# Patient Record
Sex: Male | Born: 1950 | Race: Black or African American | Hispanic: No | Marital: Single | Smoking: Current every day smoker
Health system: Southern US, Community
[De-identification: ages and names within clinical notes are randomized; demographics above are authoritative.]

## PROBLEM LIST (undated history)

## (undated) DIAGNOSIS — B1920 Unspecified viral hepatitis C without hepatic coma: Secondary | ICD-10-CM

## (undated) HISTORY — DX: Unspecified viral hepatitis C without hepatic coma: B19.20

## (undated) HISTORY — PX: APPENDECTOMY: SHX54

## (undated) HISTORY — PX: OTHER SURGICAL HISTORY: SHX169

---

## 2009-03-29 ENCOUNTER — Other Ambulatory Visit: Payer: Self-pay | Admitting: Emergency Medicine

## 2009-03-29 ENCOUNTER — Ambulatory Visit: Payer: Self-pay | Admitting: Psychiatry

## 2009-03-30 ENCOUNTER — Inpatient Hospital Stay (HOSPITAL_COMMUNITY): Admission: AD | Admit: 2009-03-30 | Discharge: 2009-04-07 | Payer: Self-pay | Admitting: Psychiatry

## 2009-07-27 ENCOUNTER — Ambulatory Visit: Payer: Self-pay | Admitting: Cardiology

## 2009-11-26 ENCOUNTER — Emergency Department (HOSPITAL_COMMUNITY): Admission: EM | Admit: 2009-11-26 | Discharge: 2009-11-27 | Payer: Self-pay | Admitting: Emergency Medicine

## 2009-12-08 ENCOUNTER — Emergency Department (HOSPITAL_COMMUNITY): Admission: EM | Admit: 2009-12-08 | Discharge: 2009-12-09 | Payer: Self-pay | Admitting: Emergency Medicine

## 2010-01-24 ENCOUNTER — Ambulatory Visit: Payer: Self-pay | Admitting: Cardiology

## 2010-09-02 LAB — BASIC METABOLIC PANEL
BUN: 11 mg/dL (ref 6–23)
Potassium: 4.1 mEq/L (ref 3.5–5.1)

## 2010-09-02 LAB — DIFFERENTIAL
Basophils Absolute: 0 10*3/uL (ref 0.0–0.1)
Basophils Relative: 1 % (ref 0–1)
Eosinophils Relative: 2 % (ref 0–5)
Lymphs Abs: 2.3 10*3/uL (ref 0.7–4.0)
Monocytes Absolute: 0.9 10*3/uL (ref 0.1–1.0)
Monocytes Relative: 10 % (ref 3–12)
Neutrophils Relative %: 60 % (ref 43–77)

## 2010-09-02 LAB — POCT CARDIAC MARKERS
Myoglobin, poc: 83.1 ng/mL (ref 12–200)
Troponin i, poc: <0.05 ng/mL (ref 0.00–0.09)

## 2010-09-02 LAB — D-DIMER, QUANTITATIVE: D-Dimer, Quant: 0.23 ug/mL-FEU (ref 0.00–0.48)

## 2010-09-02 LAB — CBC: Hemoglobin: 14.2 g/dL (ref 13.0–17.0)

## 2010-09-03 LAB — BASIC METABOLIC PANEL
CO2: 28 mEq/L (ref 19–32)
Calcium: 9.4 mg/dL (ref 8.4–10.5)
Chloride: 105 mEq/L (ref 96–112)
Glucose, Bld: 89 mg/dL (ref 70–99)
Potassium: 3.9 mEq/L (ref 3.5–5.1)
Sodium: 140 mEq/L (ref 135–145)

## 2010-09-03 LAB — CBC
HCT: 41.3 % (ref 39.0–52.0)
MCV: 86.7 fL (ref 78.0–100.0)
Platelets: 260 10*3/uL (ref 150–400)
RDW: 14.1 % (ref 11.5–15.5)
WBC: 8.8 10*3/uL (ref 4.0–10.5)

## 2010-09-03 LAB — DIFFERENTIAL
Eosinophils Relative: 2 % (ref 0–5)
Lymphocytes Relative: 25 % (ref 12–46)

## 2010-09-20 LAB — DIFFERENTIAL
Basophils Absolute: 0 10*3/uL (ref 0.0–0.1)
Basophils Relative: 1 % (ref 0–1)
Monocytes Absolute: 0.5 10*3/uL (ref 0.1–1.0)
Neutro Abs: 4.6 10*3/uL (ref 1.7–7.7)
Neutrophils Relative %: 61 % (ref 43–77)

## 2010-09-20 LAB — GLUCOSE, CAPILLARY
Glucose-Capillary: 112 mg/dL — ABNORMAL HIGH (ref 70–99)
Glucose-Capillary: 120 mg/dL — ABNORMAL HIGH (ref 70–99)
Glucose-Capillary: 121 mg/dL — ABNORMAL HIGH (ref 70–99)
Glucose-Capillary: 123 mg/dL — ABNORMAL HIGH (ref 70–99)
Glucose-Capillary: 141 mg/dL — ABNORMAL HIGH (ref 70–99)
Glucose-Capillary: 145 mg/dL — ABNORMAL HIGH (ref 70–99)
Glucose-Capillary: 241 mg/dL — ABNORMAL HIGH (ref 70–99)
Glucose-Capillary: 80 mg/dL (ref 70–99)
Glucose-Capillary: 83 mg/dL (ref 70–99)

## 2010-09-20 LAB — RAPID URINE DRUG SCREEN, HOSP PERFORMED
Amphetamines: NOT DETECTED
Barbiturates: NOT DETECTED
Benzodiazepines: POSITIVE — AB
Cocaine: NOT DETECTED
Opiates: NOT DETECTED

## 2010-09-20 LAB — BASIC METABOLIC PANEL
BUN: 10 mg/dL (ref 6–23)
CO2: 29 mEq/L (ref 19–32)
Calcium: 9.6 mg/dL (ref 8.4–10.5)
Creatinine, Ser: 0.95 mg/dL (ref 0.4–1.5)
Glucose, Bld: 114 mg/dL — ABNORMAL HIGH (ref 70–99)

## 2010-09-20 LAB — CBC
Hemoglobin: 15.9 g/dL (ref 13.0–17.0)
MCHC: 33.1 g/dL (ref 30.0–36.0)
Platelets: 209 10*3/uL (ref 150–400)
RDW: 14.4 % (ref 11.5–15.5)

## 2010-09-20 LAB — HEMOGLOBIN A1C: Hgb A1c MFr Bld: 9.7 % — ABNORMAL HIGH (ref 4.6–6.1)

## 2011-01-08 ENCOUNTER — Encounter (INDEPENDENT_AMBULATORY_CARE_PROVIDER_SITE_OTHER): Payer: Self-pay | Admitting: Internal Medicine

## 2011-01-08 ENCOUNTER — Ambulatory Visit (INDEPENDENT_AMBULATORY_CARE_PROVIDER_SITE_OTHER): Payer: Medicaid Other | Admitting: Internal Medicine

## 2011-01-08 ENCOUNTER — Encounter (INDEPENDENT_AMBULATORY_CARE_PROVIDER_SITE_OTHER): Payer: Self-pay | Admitting: *Deleted

## 2011-01-08 VITALS — BP 90/60 | HR 72 | Temp 97.8°F | Ht 73.0 in | Wt 185.4 lb

## 2011-01-08 DIAGNOSIS — Z1211 Encounter for screening for malignant neoplasm of colon: Secondary | ICD-10-CM

## 2011-01-08 DIAGNOSIS — B1920 Unspecified viral hepatitis C without hepatic coma: Secondary | ICD-10-CM

## 2011-01-08 LAB — HEPATIC FUNCTION PANEL
ALT: 36 U/L (ref 0–53)
Bilirubin, Direct: 0.1 mg/dL (ref 0.0–0.3)
Total Protein: 7.5 g/dL (ref 6.0–8.3)

## 2011-01-08 NOTE — Patient Instructions (Signed)
The risk and benefits were reviewed with patient and he is agreeable to proceed with the colonoscopy.

## 2011-01-08 NOTE — Progress Notes (Signed)
Subjective:     Patient ID: BERTHEL BAGNALL, male   DOB: 25-Dec-1950, 60 y.o.   MRN: [id number]  HPI Mr. Conlee is a 60 yr old black male referred to our office by Dr. Gae Gallop for a screening colonoscopy and evaluation of possible tx of Hepatitis C.   [place] has never undergone a screening colonoscopy.  Appetite is good. No dysphagia. No weight loss. No abdominal pain. BM x 1-2 a day.  No bright red rectal bleeding or melena. He has a hx of IV heroin use years ago.  He has also used Cocaine in the past.  [facility] for Haldol injections. 09/11/2010 HCV RNA [id number], Genotype 1B. Hx of schizophrenia  Review of Systems     Objective:   Physical ExamAlert and oriented. Skin warm and dry. Oral mucosa is moist. Patient is endentulous.Sclera anicteric, conjunctivae is pink. No cervical lymphadenopathy. Lungs clear. Heart regular rate and rhythm.  Abdomen is soft. Bowel sounds are positive. No hepatomegaly. No abdominal masses felt. No tenderness. No edema to lower extremities. Patient is alert and oriented.  Patient smells of urine.     Assessment:     Mr. Pasquariello is a 60 yr old male in need of a screening colonoscopy. He has never undergone a colonoscopy in the past.  As far as his Hepatitis C, we will hold at this time due to his hx of schizophrenia.  Will get a hepatic function on him today.    Plan:      Will schedule a colonoscopy with Dr. Karilyn Cota. The risk and benefits were reviewed with the patient and he is agreeable.    Will also get a hepatic function on him today.  Further recommendations once I talk with Dr Karilyn Cota.

## 2011-01-09 ENCOUNTER — Telehealth (INDEPENDENT_AMBULATORY_CARE_PROVIDER_SITE_OTHER): Payer: Self-pay | Admitting: Internal Medicine

## 2011-02-19 MED ORDER — SODIUM CHLORIDE 0.45 % IV SOLN
Freq: Once | INTRAVENOUS | Status: AC
  Administered 2011-02-20: 12:00:00 via INTRAVENOUS

## 2011-02-20 ENCOUNTER — Encounter (HOSPITAL_COMMUNITY): Payer: Self-pay | Admitting: *Deleted

## 2011-02-20 ENCOUNTER — Other Ambulatory Visit (INDEPENDENT_AMBULATORY_CARE_PROVIDER_SITE_OTHER): Payer: Self-pay | Admitting: Internal Medicine

## 2011-02-20 ENCOUNTER — Ambulatory Visit (HOSPITAL_COMMUNITY)
Admission: RE | Admit: 2011-02-20 | Discharge: 2011-02-20 | Disposition: A | Discharge status: Home / Self Care | Payer: Medicaid Other | Source: Ambulatory Visit | Attending: Internal Medicine | Admitting: Internal Medicine

## 2011-02-20 ENCOUNTER — Encounter (HOSPITAL_COMMUNITY)
Admission: RE | Disposition: A | Discharge status: Home / Self Care | Payer: Self-pay | Source: Ambulatory Visit | Attending: Internal Medicine

## 2011-02-20 DIAGNOSIS — K644 Residual hemorrhoidal skin tags: Secondary | ICD-10-CM | POA: Insufficient documentation

## 2011-02-20 DIAGNOSIS — Z1211 Encounter for screening for malignant neoplasm of colon: Secondary | ICD-10-CM

## 2011-02-20 DIAGNOSIS — K6389 Other specified diseases of intestine: Secondary | ICD-10-CM

## 2011-02-20 DIAGNOSIS — E119 Type 2 diabetes mellitus without complications: Secondary | ICD-10-CM | POA: Insufficient documentation

## 2011-02-20 DIAGNOSIS — D126 Benign neoplasm of colon, unspecified: Secondary | ICD-10-CM | POA: Insufficient documentation

## 2011-02-20 HISTORY — PX: COLONOSCOPY: SHX5424

## 2011-02-20 SURGERY — COLONOSCOPY
Anesthesia: Moderate Sedation

## 2011-02-20 MED ORDER — MIDAZOLAM HCL 5 MG/5ML IJ SOLN
INTRAMUSCULAR | Status: AC
  Filled 2011-02-20: qty 10

## 2011-02-20 MED ORDER — MEPERIDINE HCL 50 MG/ML IJ SOLN
INTRAMUSCULAR | Status: AC
  Filled 2011-02-20: qty 1

## 2011-02-20 MED ORDER — HYDROCORTISONE ACETATE 25 MG RE SUPP: 25.0000 mg | Freq: Two times a day (BID) | RECTAL | Status: AC

## 2011-02-20 MED ORDER — SIMETHICONE 40 MG/0.6ML PO SUSP
ORAL | Status: DC | PRN
  Administered 2011-02-20: 12:00:00

## 2011-02-20 MED ORDER — MIDAZOLAM HCL 5 MG/5ML IJ SOLN
INTRAMUSCULAR | Status: DC | PRN
  Administered 2011-02-20 (×2): 2 mg via INTRAVENOUS

## 2011-02-20 MED ORDER — MEPERIDINE HCL 50 MG/ML IJ SOLN
INTRAMUSCULAR | Status: DC | PRN
  Administered 2011-02-20: 25 mg via INTRAVENOUS

## 2011-02-20 NOTE — OR Nursing (Signed)
Pt with IV line to right hand; infusing properly; dressing clean, dry & intact; 0.45 Normal saline infusing
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
## 2011-02-20 NOTE — Op Note (Signed)
COLONOSCOPY PROCEDURE REPORT  PATIENT:  Shawn Mitchell  MR#:  [id number] Birthdate:  [birth year], 60 y.o., male Endoscopist:  Dr. Malissa Hippo, MD Referred By:  Dr. Colon Branch, MD Procedure Date: 02/20/2011  Procedure:   Colonoscopy with polypectomy.  Indications:  Average risk screening colonoscopy.  Informed Consent: Procedure and risks were reviewed with the patient and informed consent was obtained.  Medications:  Demerol 50 mg IV Versed 4 mg IV  Description of procedure:  After a digital rectal exam was performed, that colonoscope was advanced from the anus through the rectum and colon to the area of the cecum, ileocecal valve and appendiceal orifice. The cecum was deeply intubated. These structures were well-seen and photographed for the record. From the level of the cecum and ileocecal valve, the scope was slowly and cautiously withdrawn. The mucosal surfaces were carefully surveyed utilizing scope tip to flexion to facilitate fold flattening as needed. The scope was pulled down into the rectum where a thorough exam including retroflexion was performed.  Findings:   Preparation was fair to satisfactory with lot of liquid stool in the distal half of the colon. Small polyp ablated via cold biopsy from hepatic flexure. Small polyp ablated via cold biopsy from proximal sigmoid colon. 10 mm pedunculated polyp mid from mid sigmoid colon. Small external hemorrhoids; linear mucosal disruption at dentate line secondary to retroflexion.  Therapeutic/Diagnostic Maneuvers Performed:  See above  Complications:  Linear mucosal disruption at dentate line secondary to retroflexion.  Cecal Withdrawal Time: 23 minutes  Impression:  Examination performed to cecum Examination somewhat limited because of quality of prep . 2 small polyps ablated via cold biopsy. 10 mm pedunculated polyp snared from sigmoid colon. Small linear mucosal  disruption at dentate line secondary to  retroflexion. External hemorrhoids.   Recommendations:  No aspirin for 10 days. Anusol HC suppository 1 per rectum at bedtime daily for 12 days. Physician  will contact you with results of biopsy. We will also get results of tests pertaining to hepatitis C from Dr. Waynard Reeds office and determine next step.  Wyatte Dames U  02/20/2011 12:52 PM  CC: Dr. Colon Branch, MD

## 2011-02-27 ENCOUNTER — Encounter (HOSPITAL_COMMUNITY): Payer: Self-pay | Admitting: Internal Medicine

## 2011-03-08 ENCOUNTER — Encounter (INDEPENDENT_AMBULATORY_CARE_PROVIDER_SITE_OTHER): Payer: Self-pay | Admitting: *Deleted

## 2011-03-13 ENCOUNTER — Other Ambulatory Visit (INDEPENDENT_AMBULATORY_CARE_PROVIDER_SITE_OTHER): Payer: Self-pay | Admitting: Internal Medicine

## 2011-03-13 DIAGNOSIS — B1920 Unspecified viral hepatitis C without hepatic coma: Secondary | ICD-10-CM

## 2011-03-19 ENCOUNTER — Ambulatory Visit (HOSPITAL_COMMUNITY): Payer: Medicaid Other

## 2011-03-25 ENCOUNTER — Ambulatory Visit (HOSPITAL_COMMUNITY)
Admission: RE | Admit: 2011-03-25 | Discharge: 2011-03-25 | Disposition: A | Discharge status: Home / Self Care | Payer: Medicaid Other | Source: Ambulatory Visit | Attending: Internal Medicine | Admitting: Internal Medicine

## 2011-03-25 DIAGNOSIS — B1920 Unspecified viral hepatitis C without hepatic coma: Secondary | ICD-10-CM | POA: Insufficient documentation

## 2012-03-25 ENCOUNTER — Encounter (INDEPENDENT_AMBULATORY_CARE_PROVIDER_SITE_OTHER): Payer: Self-pay | Admitting: *Deleted

## 2012-04-02 ENCOUNTER — Encounter (INDEPENDENT_AMBULATORY_CARE_PROVIDER_SITE_OTHER): Payer: Self-pay | Admitting: Internal Medicine

## 2012-04-02 ENCOUNTER — Ambulatory Visit (INDEPENDENT_AMBULATORY_CARE_PROVIDER_SITE_OTHER): Payer: Medicaid Other | Admitting: Internal Medicine

## 2012-04-02 VITALS — BP 100/64 | HR 92 | Temp 98.0°F | Ht 72.0 in | Wt 194.9 lb

## 2012-04-02 DIAGNOSIS — F209 Schizophrenia, unspecified: Secondary | ICD-10-CM

## 2012-04-02 DIAGNOSIS — B1920 Unspecified viral hepatitis C without hepatic coma: Secondary | ICD-10-CM | POA: Insufficient documentation

## 2012-04-02 NOTE — Progress Notes (Signed)
Subjective:     Patient ID: Shawn Mitchell, male   DOB: Sep 05, 1950, 61 y.o.   MRN: [id number]  HPI Her for follow up of Hep. C.  Hep C RNA Quantitative 09/11/2010 [id number] high.  Genotype 1B Hx of schizophrenia. Doing good. Appetite is good. No weight loss. States he was an IV drug user in the past. He has a BM once a day. No melena or bright red rectal bleeding. Diagnosed schizophrenia years ago.    CMP     Component Value Date/Time   NA 142 12/09/2009 0233   K 4.1 12/09/2009 0233   CL 108 12/09/2009 0233   CO2 27 12/09/2009 0233   GLUCOSE 126* 12/09/2009 0233   BUN 11 12/09/2009 0233   CREATININE 1.09 12/09/2009 0233   CALCIUM 9.7 12/09/2009 0233   PROT 7.5 01/08/2011 1133   ALBUMIN 4.5 01/08/2011 1133   AST 27 01/08/2011 1133   ALT 36 01/08/2011 1133   ALKPHOS 81 01/08/2011 1133   BILITOT 0.4 01/08/2011 1133   GFRNONAA >60 12/09/2009 0233   GFRAA  Value: >60        The eGFR has been calculated using the MDRD equation. This calculation has not been validated in all clinical situations. eGFR's persistently <60 mL/min signify possible Chronic Kidney Disease. 12/09/2009 0233     03/13/2011 US abdomen for Hep C  :IMPRESSION:  Incomplete pancreatic visualization due to bowel gas.  Otherwise normal exam.  Original Report Authenticated By: Lollie Marrow, M.D.  Review of Systems see hpi     Objective:   Physical Exam Filed Vitals:   04/02/12 1449  BP: 100/64  Pulse: 92  Temp: 98 F (36.7 C)  Height: 6' (1.829 m)  Weight: 194 lb 14.4 oz (88.406 kg)   Alert and oriented. Skin warm and dry. Oral mucosa is moist.   . Sclera anicteric, conjunctivae is pink. Thyroid not enlarged. No cervical lymphadenopathy. Lungs clear. Heart regular rate and rhythm.  Abdomen is soft. Bowel sounds are positive. No hepatomegaly. No abdominal masses felt. No tenderness.  No edema to lower extremities.       Assessment:    Hep C. Patient is not a candidate for Hep C. Treatment. Will continue to monitor.   Plan:    Cmet in 1 yr. With a AFP. OV in one year.

## 2012-04-02 NOTE — Patient Instructions (Signed)
Not a candidate for Hep C. Will follow up in one year.

## 2012-04-03 LAB — HEPATIC FUNCTION PANEL
Albumin: 4.2 g/dL (ref 3.5–5.2)
Alkaline Phosphatase: 99 U/L (ref 39–117)
Indirect Bilirubin: 0.3 mg/dL (ref 0.0–0.9)
Total Bilirubin: 0.4 mg/dL (ref 0.3–1.2)

## 2013-03-30 ENCOUNTER — Encounter (INDEPENDENT_AMBULATORY_CARE_PROVIDER_SITE_OTHER): Payer: Self-pay | Admitting: *Deleted

## 2013-05-11 ENCOUNTER — Ambulatory Visit: Payer: Medicaid Other | Admitting: Cardiology

## 2013-05-20 ENCOUNTER — Ambulatory Visit: Payer: Medicaid Other | Admitting: Cardiovascular Disease

## 2013-06-11 ENCOUNTER — Encounter: Payer: Self-pay | Admitting: Cardiovascular Disease

## 2013-06-11 ENCOUNTER — Ambulatory Visit: Payer: Medicaid Other | Admitting: Cardiovascular Disease

## 2013-06-29 ENCOUNTER — Ambulatory Visit (INDEPENDENT_AMBULATORY_CARE_PROVIDER_SITE_OTHER): Payer: Medicaid Other | Admitting: Cardiology

## 2013-06-29 ENCOUNTER — Encounter: Payer: Self-pay | Admitting: Cardiology

## 2013-06-29 VITALS — BP 133/82 | HR 80 | Ht 73.0 in | Wt 177.0 lb

## 2013-06-29 DIAGNOSIS — R0789 Other chest pain: Secondary | ICD-10-CM

## 2013-06-29 DIAGNOSIS — E785 Hyperlipidemia, unspecified: Secondary | ICD-10-CM

## 2013-06-29 DIAGNOSIS — I10 Essential (primary) hypertension: Secondary | ICD-10-CM

## 2013-06-29 MED ORDER — NICOTINE 21 MG/24HR TD PT24: MEDICATED_PATCH | TRANSDERMAL | Status: DC

## 2013-06-29 MED ORDER — NICOTINE 7 MG/24HR TD PT24: MEDICATED_PATCH | TRANSDERMAL | Status: DC

## 2013-06-29 MED ORDER — NICOTINE 14 MG/24HR TD PT24: MEDICATED_PATCH | TRANSDERMAL | Status: DC
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
## 2013-06-29 NOTE — Patient Instructions (Signed)
Your physician recommends that you schedule a follow-up appointment in: 1 year with Dr. Harl Bowie. You should receive a letter in the mail in 10 months. If you do not receive this letter by November 2015 call our office to schedule this appointment.   Your physician has recommended you make the following change in your medication:  Start Nicotine patches as follows: 21 MG patch 6 weeks, 14 MG patch for 2 weeks, and 7 MG patch for 2 weeks.   Continue all other medications the same.

## 2013-07-05 ENCOUNTER — Ambulatory Visit (INDEPENDENT_AMBULATORY_CARE_PROVIDER_SITE_OTHER): Payer: Medicaid Other | Admitting: Internal Medicine

## 2013-07-05 ENCOUNTER — Encounter (INDEPENDENT_AMBULATORY_CARE_PROVIDER_SITE_OTHER): Payer: Self-pay | Admitting: Internal Medicine

## 2013-07-05 VITALS — BP 108/72 | HR 84 | Temp 98.0°F | Ht 73.0 in | Wt 178.0 lb

## 2013-07-05 DIAGNOSIS — R894 Abnormal immunological findings in specimens from other organs, systems and tissues: Secondary | ICD-10-CM

## 2013-07-05 DIAGNOSIS — R768 Other specified abnormal immunological findings in serum: Secondary | ICD-10-CM

## 2013-07-05 LAB — CBC WITH DIFFERENTIAL/PLATELET
BASOS ABS: 0 10*3/uL (ref 0.0–0.1)
BASOS PCT: 0 % (ref 0–1)
EOS PCT: 3 % (ref 0–5)
Eosinophils Absolute: 0.2 10*3/uL (ref 0.0–0.7)
HEMATOCRIT: 51.7 % (ref 39.0–52.0)
Hemoglobin: 17.3 g/dL — ABNORMAL HIGH (ref 13.0–17.0)
Lymphocytes Relative: 28 % (ref 12–46)
Lymphs Abs: 2.5 10*3/uL (ref 0.7–4.0)
MCH: 26.9 pg (ref 26.0–34.0)
MCHC: 33.5 g/dL (ref 30.0–36.0)
MCV: 80.3 fL (ref 78.0–100.0)
MONO ABS: 0.7 10*3/uL (ref 0.1–1.0)
Monocytes Relative: 8 % (ref 3–12)
NEUTROS ABS: 5.3 10*3/uL (ref 1.7–7.7)
Neutrophils Relative %: 61 % (ref 43–77)
PLATELETS: 250 10*3/uL (ref 150–400)
RBC: 6.44 MIL/uL — ABNORMAL HIGH (ref 4.22–5.81)
RDW: 15 % (ref 11.5–15.5)
WBC: 8.7 10*3/uL (ref 4.0–10.5)

## 2013-07-05 LAB — HEPATIC FUNCTION PANEL
ALK PHOS: 103 U/L (ref 39–117)
ALT: 36 U/L (ref 0–53)
AST: 20 U/L (ref 0–37)
Albumin: 4.5 g/dL (ref 3.5–5.2)
BILIRUBIN INDIRECT: 0.3 mg/dL (ref 0.0–0.9)
Bilirubin, Direct: 0.1 mg/dL (ref 0.0–0.3)
TOTAL PROTEIN: 7.7 g/dL (ref 6.0–8.3)
Total Bilirubin: 0.4 mg/dL (ref 0.3–1.2)

## 2013-07-05 LAB — PROTIME-INR
INR: 1.03 (ref <1.50)
Prothrombin Time: 13.4 seconds (ref 11.6–15.2)

## 2013-07-05 LAB — TSH: TSH: 0.821 u[IU]/mL (ref 0.350–4.500)

## 2013-07-05 NOTE — Patient Instructions (Signed)
LABS and OV in 6 months with Dr. Laural Golden
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
## 2013-07-06 LAB — AFP TUMOR MARKER: AFP-Tumor Marker: 6.8 ng/mL (ref 0.0–8.0)

## 2013-07-08 LAB — HEPATITIS C RNA QUANTITATIVE
HCV Quantitative Log: 6.04 {Log} — ABNORMAL HIGH (ref <1.18)
HCV Quantitative: 1104331 IU/mL — ABNORMAL HIGH (ref <15)

## 2013-07-20 ENCOUNTER — Ambulatory Visit (HOSPITAL_COMMUNITY)
Admission: RE | Admit: 2013-07-20 | Discharge: 2013-07-20 | Disposition: A | Discharge status: Home / Self Care | Payer: Medicaid Other | Source: Ambulatory Visit | Attending: Internal Medicine | Admitting: Internal Medicine

## 2013-07-20 DIAGNOSIS — R768 Other specified abnormal immunological findings in serum: Secondary | ICD-10-CM

## 2013-07-20 DIAGNOSIS — B1920 Unspecified viral hepatitis C without hepatic coma: Secondary | ICD-10-CM | POA: Insufficient documentation

## 2013-08-03 ENCOUNTER — Ambulatory Visit (INDEPENDENT_AMBULATORY_CARE_PROVIDER_SITE_OTHER): Payer: Medicaid Other | Admitting: Internal Medicine

## 2013-08-11 ENCOUNTER — Ambulatory Visit (INDEPENDENT_AMBULATORY_CARE_PROVIDER_SITE_OTHER): Payer: Medicaid Other | Admitting: Internal Medicine

## 2013-08-11 ENCOUNTER — Encounter (INDEPENDENT_AMBULATORY_CARE_PROVIDER_SITE_OTHER): Payer: Self-pay | Admitting: Internal Medicine

## 2013-08-11 VITALS — BP 124/80 | HR 88 | Temp 98.3°F | Ht 73.0 in | Wt 181.2 lb

## 2013-08-11 DIAGNOSIS — B1920 Unspecified viral hepatitis C without hepatic coma: Secondary | ICD-10-CM

## 2013-08-11 NOTE — Patient Instructions (Signed)
[place] RUQ with elastrography.
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
## 2013-08-13 NOTE — Addendum Note (Signed)
Addended by: Butch Penny on: 08/13/2013 09:15 AM   Modules accepted: Orders

## 2013-08-23 ENCOUNTER — Ambulatory Visit (HOSPITAL_COMMUNITY): Payer: Medicaid Other | Attending: Internal Medicine

## 2013-12-16 ENCOUNTER — Telehealth (INDEPENDENT_AMBULATORY_CARE_PROVIDER_SITE_OTHER): Payer: Self-pay | Admitting: *Deleted

## 2013-12-16 NOTE — Telephone Encounter (Signed)
Patient needs an OV in July. Not urgent.

## 2013-12-16 NOTE — Telephone Encounter (Signed)
Shawn Mitchell, Shawn Mitchell was last seen on 08/11/13 and he was going to have an [place] rt upper quadrant, Elastrography then we will treat with Harvoni x 12 weeks. Patient has not had a f/u apt and the July has been cancelled also. Please advised what the next step should be.

## 2013-12-20 NOTE — Telephone Encounter (Signed)
Apt has been scheduled for 01/10/14 at 2:00 pm with Deberah Castle, NP.

## 2014-01-10 ENCOUNTER — Ambulatory Visit (INDEPENDENT_AMBULATORY_CARE_PROVIDER_SITE_OTHER): Payer: Medicaid Other | Admitting: Internal Medicine

## 2014-01-10 ENCOUNTER — Encounter (INDEPENDENT_AMBULATORY_CARE_PROVIDER_SITE_OTHER): Payer: Self-pay | Admitting: Internal Medicine

## 2014-01-10 ENCOUNTER — Encounter (INDEPENDENT_AMBULATORY_CARE_PROVIDER_SITE_OTHER): Payer: Self-pay | Admitting: *Deleted

## 2014-01-10 VITALS — BP 106/74 | HR 76 | Temp 97.7°F | Ht 72.0 in | Wt 187.9 lb

## 2014-01-10 DIAGNOSIS — B182 Chronic viral hepatitis C: Secondary | ICD-10-CM
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
## 2014-01-10 NOTE — Patient Instructions (Signed)
OV pending.  

## 2014-01-10 NOTE — Addendum Note (Signed)
Addended by: Butch Penny on: 01/10/2014 04:57 PM   Modules accepted: Orders

## 2014-01-13 ENCOUNTER — Ambulatory Visit (HOSPITAL_COMMUNITY)
Admission: RE | Admit: 2014-01-13 | Discharge: 2014-01-13 | Disposition: A | Discharge status: Home / Self Care | Payer: Medicaid Other | Source: Ambulatory Visit | Attending: Internal Medicine | Admitting: Internal Medicine

## 2014-01-13 DIAGNOSIS — E119 Type 2 diabetes mellitus without complications: Secondary | ICD-10-CM | POA: Insufficient documentation

## 2014-01-13 DIAGNOSIS — B182 Chronic viral hepatitis C: Secondary | ICD-10-CM | POA: Insufficient documentation

## 2014-01-19 LAB — COMPREHENSIVE METABOLIC PANEL
ALT: 46 U/L (ref 0–53)
AST: 24 U/L (ref 0–37)
Albumin: 4 g/dL (ref 3.5–5.2)
Alkaline Phosphatase: 102 U/L (ref 39–117)
BUN: 16 mg/dL (ref 6–23)
CALCIUM: 9.6 mg/dL (ref 8.4–10.5)
CHLORIDE: 100 meq/L (ref 96–112)
CO2: 30 meq/L (ref 19–32)
CREATININE: 1.06 mg/dL (ref 0.50–1.35)
GLUCOSE: 258 mg/dL — AB (ref 70–99)
Potassium: 4.1 mEq/L (ref 3.5–5.3)
Sodium: 139 mEq/L (ref 135–145)
Total Bilirubin: 0.4 mg/dL (ref 0.2–1.2)
Total Protein: 6.7 g/dL (ref 6.0–8.3)

## 2014-01-19 LAB — CBC WITH DIFFERENTIAL/PLATELET
Basophils Absolute: 0 10*3/uL (ref 0.0–0.1)
Basophils Relative: 0 % (ref 0–1)
EOS PCT: 4 % (ref 0–5)
Eosinophils Absolute: 0.3 10*3/uL (ref 0.0–0.7)
HCT: 44.1 % (ref 39.0–52.0)
Hemoglobin: 15.1 g/dL (ref 13.0–17.0)
LYMPHS ABS: 2.8 10*3/uL (ref 0.7–4.0)
Lymphocytes Relative: 40 % (ref 12–46)
MCH: 26.8 pg (ref 26.0–34.0)
MCHC: 34.2 g/dL (ref 30.0–36.0)
MCV: 78.2 fL (ref 78.0–100.0)
Monocytes Absolute: 0.6 10*3/uL (ref 0.1–1.0)
Monocytes Relative: 8 % (ref 3–12)
Neutro Abs: 3.4 10*3/uL (ref 1.7–7.7)
Neutrophils Relative %: 48 % (ref 43–77)
Platelets: 224 10*3/uL (ref 150–400)
RBC: 5.64 MIL/uL (ref 4.22–5.81)
RDW: 15.5 % (ref 11.5–15.5)
WBC: 7.1 10*3/uL (ref 4.0–10.5)

## 2014-01-20 LAB — AFP TUMOR MARKER: AFP-Tumor Marker: 6.4 ng/mL (ref 0.0–8.0)

## 2014-01-21 ENCOUNTER — Telehealth (INDEPENDENT_AMBULATORY_CARE_PROVIDER_SITE_OTHER): Payer: Self-pay | Admitting: Internal Medicine

## 2014-01-21 LAB — HEPATITIS C RNA QUANTITATIVE
HCV Quantitative Log: 6.64 {Log} — ABNORMAL HIGH (ref <1.18)
HCV Quantitative: 4396639 IU/mL — ABNORMAL HIGH (ref <15)

## 2014-01-21 NOTE — Telephone Encounter (Signed)
New Hep C. Patient. Will be treated with Harvoni only. Hx of Schizophrenia. Discussed with Dr. Laural Golden.

## 2014-01-31 NOTE — Telephone Encounter (Signed)
The paper work has been started and will be faxed to [employer] for PA to be completed. Patient will be made aware of outcomes as this information becomes available to [place].

## 2014-02-02 ENCOUNTER — Telehealth (INDEPENDENT_AMBULATORY_CARE_PROVIDER_SITE_OTHER): Payer: Self-pay | Admitting: *Deleted

## 2014-02-02 NOTE — Telephone Encounter (Signed)
Am going to let Dr. Laural Golden review this to see if he wants to treat with Linzie Collin and Ribavirin

## 2014-02-02 NOTE — Telephone Encounter (Signed)
Terri - Recd' a call from [place] with [employer], she states that the patient 's Insurance ([place]) will not cover the [place]. Their preferred is the [facility]. She is asking if you would want to change this prescription. We will also need to address the Ribavirin , if you are going to change the prescription.

## 2014-02-07 ENCOUNTER — Telehealth (INDEPENDENT_AMBULATORY_CARE_PROVIDER_SITE_OTHER): Payer: Self-pay | Admitting: Internal Medicine

## 2014-02-07 NOTE — Telephone Encounter (Signed)
He is a Genotype 1B. He has a hx of schizophrenia and lives in a group home.

## 2014-02-07 NOTE — Telephone Encounter (Signed)
Tammy, Viekira x 12 weeks.  No Ribavirin

## 2014-02-07 NOTE — Telephone Encounter (Signed)
What is the genotype.

## 2014-02-07 NOTE — Telephone Encounter (Signed)
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

## 2014-02-10 NOTE — Telephone Encounter (Signed)
[employer] has sent a Beneficiary Readiness Evaluation for provider to go over questions with the patient ,patient sign . And then we fax back to [employer].

## 2014-02-23 ENCOUNTER — Telehealth (INDEPENDENT_AMBULATORY_CARE_PROVIDER_SITE_OTHER): Payer: Self-pay | Admitting: *Deleted

## 2014-02-23 NOTE — Telephone Encounter (Signed)
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 He has a hx of schizophrenia and lives in a group home.           Rogene Houston, MD at 02/07/2014  2:20 PM      Status: Signed            What is the genotype.         Butch Penny, NP at 02/02/2014 12:07 PM      Status: Signed            Am going to let Dr. Laural Golden review this to see if he wants to treat with Linzie Collin and Ribavirin         Grayland Ormond, LPN at 0/86/7619 50:93 AM      Status: Signed            Terri - Recd' a call from [place] with [employer], she states that the patient 's Insurance ([place]) will not cover the [place]. Their preferred is the [facility]. She is asking if you would want to change this prescription. We will also need to address the Ribavirin , if you are going to change the prescription.    Today, 02/23/14 I called Theadora Rama about this. She states that the patient will not be approved for Harvoni nor Viekera. His Fibrosis Score is F0. I have discussed with Terri. We have reached out to [employer].  A message was left asking that she call us to see if she can get Harvoni approved for [place].

## 2014-03-09 ENCOUNTER — Telehealth (INDEPENDENT_AMBULATORY_CARE_PROVIDER_SITE_OTHER): Payer: Self-pay | Admitting: *Deleted

## 2014-03-09 NOTE — Telephone Encounter (Signed)
I will let Dr. Laural Golden know also.

## 2014-03-09 NOTE — Telephone Encounter (Signed)
Shawn Mitchell, Shawn Mitchell needs an OV in 6 months with Dr Laural Golden

## 2014-03-09 NOTE — Telephone Encounter (Signed)
I rec'd a call from [place] @ [employer]. She states that the patient has been denied. His score is F-0 . He has been denied for Harvoni , and Lemmie Evens states that the Linzie Collin will not approve either due to the patient's F-score. She states that id an appeal is needed ,then the Provider would need to do so. She is also closing out his case.

## 2014-03-16 NOTE — Telephone Encounter (Signed)
Per Terri she has talked with the caregiver for the patient and she has advised Butch Penny to make an appointment for 6 months with Dr.Rehman.

## 2014-03-16 NOTE — Telephone Encounter (Signed)
Please let patient know that treatment for hepatitis C has been denied. Will see him in the office in 6 months for

## 2014-03-25 ENCOUNTER — Encounter (INDEPENDENT_AMBULATORY_CARE_PROVIDER_SITE_OTHER): Payer: Self-pay | Admitting: *Deleted

## 2014-03-25 NOTE — Telephone Encounter (Signed)
Apt has been scheduled with Dr. Laural Golden on 09/27/14.

## 2014-06-29 ENCOUNTER — Ambulatory Visit (INDEPENDENT_AMBULATORY_CARE_PROVIDER_SITE_OTHER): Payer: Medicaid Other | Admitting: Cardiology

## 2014-06-29 ENCOUNTER — Encounter: Payer: Self-pay | Admitting: Cardiology

## 2014-06-29 ENCOUNTER — Telehealth: Payer: Self-pay | Admitting: Cardiology

## 2014-06-29 VITALS — BP 120/90 | HR 82 | Ht 71.0 in | Wt 194.0 lb

## 2014-06-29 DIAGNOSIS — I10 Essential (primary) hypertension: Secondary | ICD-10-CM

## 2014-06-29 DIAGNOSIS — R0789 Other chest pain: Secondary | ICD-10-CM

## 2014-06-29 DIAGNOSIS — E785 Hyperlipidemia, unspecified: Secondary | ICD-10-CM

## 2014-06-29 MED ORDER — ATORVASTATIN CALCIUM 40 MG PO TABS: 40.0000 mg | ORAL_TABLET | Freq: Every day | ORAL | Status: AC
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
## 2014-06-29 NOTE — Telephone Encounter (Signed)
Gave verbal d/c of simvastatin to [place] at [place]

## 2014-06-29 NOTE — Patient Instructions (Signed)
Your physician wants you to follow-up in: [place] DR. BRANCH You will receive a reminder letter in the mail two months in advance. If you don't receive a letter, please call our office to schedule the follow-up appointment.  Your physician has recommended you make the following change in your medication:   STOP TAKING SIMVASTATIN  START ATORVASTATIN 40 MG DAILY  Your physician recommends that you return for lab work BMP/MAG/LIPIDS  Thank you for choosing [facility]!!

## 2014-06-29 NOTE — Telephone Encounter (Signed)
[facility] called in regards to patient being on   simvastatin (ZOCOR) 40 MG tablet Take 40 mg by mouth at bedtime  They are wanting to know if Dr. Harl Bowie wants to discontinue this medication . Please call [phone]

## 2014-09-27 ENCOUNTER — Ambulatory Visit (INDEPENDENT_AMBULATORY_CARE_PROVIDER_SITE_OTHER): Payer: Medicaid Other | Admitting: Internal Medicine

## 2014-11-16 ENCOUNTER — Encounter (INDEPENDENT_AMBULATORY_CARE_PROVIDER_SITE_OTHER): Payer: Self-pay | Admitting: *Deleted

## 2016-02-22 ENCOUNTER — Emergency Department (HOSPITAL_COMMUNITY)
Admission: EM | Admit: 2016-02-22 | Discharge: 2016-02-22 | Disposition: A | Discharge status: Home / Self Care | Payer: Medicaid Other | Attending: Emergency Medicine | Admitting: Emergency Medicine

## 2016-02-22 ENCOUNTER — Encounter (HOSPITAL_COMMUNITY): Payer: Self-pay | Admitting: *Deleted

## 2016-02-22 ENCOUNTER — Emergency Department (HOSPITAL_COMMUNITY): Payer: Medicaid Other

## 2016-02-22 DIAGNOSIS — Z794 Long term (current) use of insulin: Secondary | ICD-10-CM | POA: Insufficient documentation

## 2016-02-22 DIAGNOSIS — Y939 Activity, unspecified: Secondary | ICD-10-CM | POA: Insufficient documentation

## 2016-02-22 DIAGNOSIS — Z79899 Other long term (current) drug therapy: Secondary | ICD-10-CM | POA: Insufficient documentation

## 2016-02-22 DIAGNOSIS — E119 Type 2 diabetes mellitus without complications: Secondary | ICD-10-CM | POA: Diagnosis not present

## 2016-02-22 DIAGNOSIS — M542 Cervicalgia: Secondary | ICD-10-CM | POA: Insufficient documentation

## 2016-02-22 DIAGNOSIS — F17210 Nicotine dependence, cigarettes, uncomplicated: Secondary | ICD-10-CM | POA: Insufficient documentation

## 2016-02-22 DIAGNOSIS — Z7982 Long term (current) use of aspirin: Secondary | ICD-10-CM | POA: Insufficient documentation

## 2016-02-22 DIAGNOSIS — R410 Disorientation, unspecified: Secondary | ICD-10-CM | POA: Diagnosis not present

## 2016-02-22 DIAGNOSIS — I10 Essential (primary) hypertension: Secondary | ICD-10-CM | POA: Insufficient documentation

## 2016-02-22 DIAGNOSIS — Y999 Unspecified external cause status: Secondary | ICD-10-CM | POA: Diagnosis not present

## 2016-02-22 DIAGNOSIS — Z7984 Long term (current) use of oral hypoglycemic drugs: Secondary | ICD-10-CM | POA: Insufficient documentation

## 2016-02-22 DIAGNOSIS — Y92410 Unspecified street and highway as the place of occurrence of the external cause: Secondary | ICD-10-CM | POA: Insufficient documentation
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
## 2016-05-17 ENCOUNTER — Encounter (HOSPITAL_COMMUNITY): Payer: Self-pay | Admitting: Emergency Medicine

## 2016-05-17 ENCOUNTER — Emergency Department (HOSPITAL_COMMUNITY): Payer: Medicare Other

## 2016-05-17 ENCOUNTER — Emergency Department (HOSPITAL_COMMUNITY)
Admission: EM | Admit: 2016-05-17 | Discharge: 2016-05-18 | Disposition: A | Discharge status: Home / Self Care | Payer: Medicare Other | Attending: Emergency Medicine | Admitting: Emergency Medicine

## 2016-05-17 DIAGNOSIS — Z7982 Long term (current) use of aspirin: Secondary | ICD-10-CM | POA: Insufficient documentation

## 2016-05-17 DIAGNOSIS — I10 Essential (primary) hypertension: Secondary | ICD-10-CM | POA: Insufficient documentation

## 2016-05-17 DIAGNOSIS — Z79899 Other long term (current) drug therapy: Secondary | ICD-10-CM | POA: Diagnosis not present

## 2016-05-17 DIAGNOSIS — Z794 Long term (current) use of insulin: Secondary | ICD-10-CM | POA: Diagnosis not present

## 2016-05-17 DIAGNOSIS — F17210 Nicotine dependence, cigarettes, uncomplicated: Secondary | ICD-10-CM | POA: Diagnosis not present

## 2016-05-17 DIAGNOSIS — R0789 Other chest pain: Secondary | ICD-10-CM | POA: Insufficient documentation

## 2016-05-17 DIAGNOSIS — E119 Type 2 diabetes mellitus without complications: Secondary | ICD-10-CM | POA: Insufficient documentation

## 2016-05-17 DIAGNOSIS — R079 Chest pain, unspecified: Secondary | ICD-10-CM

## 2016-05-17 DIAGNOSIS — R072 Precordial pain: Secondary | ICD-10-CM | POA: Diagnosis present

## 2016-05-17 DIAGNOSIS — J45909 Unspecified asthma, uncomplicated: Secondary | ICD-10-CM | POA: Insufficient documentation

## 2016-05-17 LAB — CBC
HCT: 45.5 % (ref 39.0–52.0)
HEMOGLOBIN: 15.3 g/dL (ref 13.0–17.0)
MCH: 28.1 pg (ref 26.0–34.0)
MCHC: 33.6 g/dL (ref 30.0–36.0)
MCV: 83.5 fL (ref 78.0–100.0)
PLATELETS: 194 10*3/uL (ref 150–400)
RBC: 5.45 MIL/uL (ref 4.22–5.81)
RDW: 14.5 % (ref 11.5–15.5)
WBC: 10.3 10*3/uL (ref 4.0–10.5)

## 2016-05-17 LAB — BASIC METABOLIC PANEL
Anion gap: 6 (ref 5–15)
BUN: 12 mg/dL (ref 6–20)
CALCIUM: 9 mg/dL (ref 8.9–10.3)
CHLORIDE: 102 mmol/L (ref 101–111)
CO2: 30 mmol/L (ref 22–32)
CREATININE: 1.13 mg/dL (ref 0.61–1.24)
GFR calc Af Amer: >60 mL/min (ref >60)
GFR calc non Af Amer: >60 mL/min (ref >60)
GLUCOSE: 185 mg/dL — AB (ref 65–99)
Potassium: 3.5 mmol/L (ref 3.5–5.1)
Sodium: 138 mmol/L (ref 135–145)

## 2016-05-17 LAB — TROPONIN I
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
## 2016-05-17 NOTE — ED Triage Notes (Signed)
Pt called 911 for chest pain in center of chest.  Ems states pt was sinus tach at a rate in 140's.  Pt currently denies any chest pain. Pt is a&o x .

## 2016-05-18 DIAGNOSIS — R0789 Other chest pain: Secondary | ICD-10-CM | POA: Diagnosis not present

## 2016-05-18 LAB — I-STAT TROPONIN, ED: Troponin i, poc: 0.02 ng/mL (ref 0.00–0.08)

## 2016-05-18 NOTE — ED Notes (Signed)
Pt given peanut butter crackers and ice water

## 2016-07-11 ENCOUNTER — Ambulatory Visit (INDEPENDENT_AMBULATORY_CARE_PROVIDER_SITE_OTHER): Payer: Medicare Other | Admitting: Podiatry

## 2016-07-11 ENCOUNTER — Encounter: Payer: Self-pay | Admitting: Podiatry

## 2016-07-11 VITALS — BP 106/64 | HR 92 | Resp 16

## 2016-07-11 DIAGNOSIS — M79674 Pain in right toe(s): Secondary | ICD-10-CM | POA: Diagnosis not present

## 2016-07-11 DIAGNOSIS — B351 Tinea unguium: Secondary | ICD-10-CM

## 2016-07-11 DIAGNOSIS — M79675 Pain in left toe(s): Secondary | ICD-10-CM | POA: Diagnosis not present

## 2016-07-11 NOTE — Progress Notes (Signed)
   Subjective:    Patient ID: Shawn Mitchell, male    DOB: 1951-03-16, 66 y.o.   MRN: [id number]  HPI Mr. Hey presents to the office today for concerns of thick, painful, elongated toenails which he cannot trim himself. He denies any surrounding redness or drainage. The nails are painful with shoegear and pressure. He has no other complaints today.    Review of Systems  All other systems reviewed and are negative.      Objective:   Physical Exam General: AAO x3, NAD  Dermatological: Nails are hypertrophic, dystrophic, brittle, discolored, elongated 10. No surrounding redness or drainage. Tenderness nails 1-5 bilaterally. No open lesions or pre-ulcerative lesions are identified today.  Vascular: Dorsalis Pedis artery and Posterior Tibial artery pedal pulses are 2/4 bilateral with immedate capillary fill time. Pedal hair growth present.There is no pain with calf compression, swelling, warmth, erythema.   Neruologic: Grossly intact via light touch bilateral. Vibratory intact via tuning fork bilateral. Protective threshold with Semmes Wienstein monofilament intact to all pedal sites bilateral.  Musculoskeletal: No gross boney pedal deformities bilateral. No pain, crepitus, or limitation noted with foot and ankle range of motion bilateral. Muscular strength 5/5 in all groups tested bilateral.  Gait: Unassisted, Nonantalgic.      Assessment & Plan:  66 year old male with symptomatic onychomycosis -Treatment options discussed including all alternatives, risks, and complications -Etiology of symptoms were discussed -Nails debrided 10 without complications or bleeding. -Daily foot inspection -Follow-up in 3 months or sooner if any problems arise. In the meantime, encouraged to call the office with any questions, concerns, change in symptoms.   Celesta Gentile, DPM

## 2016-10-28 ENCOUNTER — Ambulatory Visit: Payer: Medicaid Other | Admitting: Podiatry

## 2016-11-07 ENCOUNTER — Ambulatory Visit: Payer: Medicaid Other | Admitting: Podiatry

## 2016-11-14 ENCOUNTER — Encounter: Payer: Self-pay | Admitting: Podiatry

## 2016-11-14 ENCOUNTER — Ambulatory Visit (INDEPENDENT_AMBULATORY_CARE_PROVIDER_SITE_OTHER): Payer: Medicaid Other | Admitting: Podiatry

## 2016-11-14 DIAGNOSIS — M79675 Pain in left toe(s): Secondary | ICD-10-CM

## 2016-11-14 DIAGNOSIS — M79676 Pain in unspecified toe(s): Secondary | ICD-10-CM

## 2016-11-14 DIAGNOSIS — B351 Tinea unguium: Secondary | ICD-10-CM | POA: Diagnosis not present

## 2016-11-14 DIAGNOSIS — M79674 Pain in right toe(s): Secondary | ICD-10-CM

## 2016-11-14 NOTE — Progress Notes (Signed)
Complaint:  Visit Type: Patient returns to my office for continued preventative foot care services. Complaint: Patient states" my nails have grown long and thick and become painful to walk and wear shoes"  The patient presents for preventative foot care services. No changes to ROS  Podiatric Exam: Vascular: dorsalis pedis and posterior tibial pulses are palpable bilateral. Capillary return is immediate. Temperature gradient is WNL. Skin turgor WNL  Sensorium: Normal Semmes Weinstein monofilament test. Normal tactile sensation bilaterally. Nail Exam: Pt has thick disfigured discolored nails with subungual debris noted bilateral entire nail hallux through fifth toenails Ulcer Exam: There is no evidence of ulcer or pre-ulcerative changes or infection. Orthopedic Exam: Muscle tone and strength are WNL. No limitations in general ROM. No crepitus or effusions noted. Foot type and digits show no abnormalities. Bony prominences are unremarkable. Skin: No Porokeratosis. No infection or ulcers  Diagnosis:  Onychomycosis, , Pain in right toe, pain in left toes  Treatment & Plan Procedures and Treatment: Consent by patient was obtained for treatment procedures. The patient understood the discussion of treatment and procedures well. All questions were answered thoroughly reviewed. Debridement of mycotic and hypertrophic toenails, 1 through 5 bilateral and clearing of subungual debris. No ulceration, no infection noted. Patient requests no dremel usage.  Return Visit-Office Procedure: Patient instructed to return to the office for a follow up visit 3 months for continued evaluation and treatment.    Mikaylee Arseneau DPM 

## 2017-02-10 ENCOUNTER — Encounter: Payer: Self-pay | Admitting: Podiatry

## 2017-02-10 ENCOUNTER — Ambulatory Visit (INDEPENDENT_AMBULATORY_CARE_PROVIDER_SITE_OTHER): Payer: Medicare Other | Admitting: Podiatry

## 2017-02-10 DIAGNOSIS — M79676 Pain in unspecified toe(s): Secondary | ICD-10-CM

## 2017-02-10 DIAGNOSIS — B351 Tinea unguium: Secondary | ICD-10-CM | POA: Diagnosis not present

## 2017-02-10 DIAGNOSIS — M79675 Pain in left toe(s): Principal | ICD-10-CM

## 2017-02-10 DIAGNOSIS — M79674 Pain in right toe(s): Principal | ICD-10-CM

## 2017-02-10 NOTE — Progress Notes (Signed)
Complaint:  Visit Type: Patient returns to my office for continued preventative foot care services. Complaint: Patient states" my nails have grown long and thick and become painful to walk and wear shoes"  The patient presents for preventative foot care services. No changes to ROS  Podiatric Exam: Vascular: dorsalis pedis and posterior tibial pulses are palpable bilateral. Capillary return is immediate. Temperature gradient is WNL. Skin turgor WNL  Sensorium: Normal Semmes Weinstein monofilament test. Normal tactile sensation bilaterally. Nail Exam: Pt has thick disfigured discolored nails with subungual debris noted bilateral entire nail hallux through fifth toenails Ulcer Exam: There is no evidence of ulcer or pre-ulcerative changes or infection. Orthopedic Exam: Muscle tone and strength are WNL. No limitations in general ROM. No crepitus or effusions noted. Foot type and digits show no abnormalities. Bony prominences are unremarkable. Skin: No Porokeratosis. No infection or ulcers  Diagnosis:  Onychomycosis, , Pain in right toe, pain in left toes  Treatment & Plan Procedures and Treatment: Consent by patient was obtained for treatment procedures. The patient understood the discussion of treatment and procedures well. All questions were answered thoroughly reviewed. Debridement of mycotic and hypertrophic toenails, 1 through 5 bilateral and clearing of subungual debris. No ulceration, no infection noted. Patient requests no dremel usage.  Return Visit-Office Procedure: Patient instructed to return to the office for a follow up visit 3 months for continued evaluation and treatment.    Gardiner Barefoot DPM

## 2017-05-19 ENCOUNTER — Ambulatory Visit: Payer: Medicaid Other | Admitting: Podiatry

## 2017-05-30 ENCOUNTER — Encounter (HOSPITAL_COMMUNITY): Payer: Self-pay

## 2017-05-30 ENCOUNTER — Emergency Department (HOSPITAL_COMMUNITY)
Admission: EM | Admit: 2017-05-30 | Discharge: 2017-05-30 | Disposition: A | Discharge status: Home / Self Care | Payer: Medicare Other | Attending: Emergency Medicine | Admitting: Emergency Medicine

## 2017-05-30 DIAGNOSIS — Z7982 Long term (current) use of aspirin: Secondary | ICD-10-CM | POA: Insufficient documentation

## 2017-05-30 DIAGNOSIS — I10 Essential (primary) hypertension: Secondary | ICD-10-CM | POA: Diagnosis not present

## 2017-05-30 DIAGNOSIS — F17210 Nicotine dependence, cigarettes, uncomplicated: Secondary | ICD-10-CM | POA: Insufficient documentation

## 2017-05-30 DIAGNOSIS — Z794 Long term (current) use of insulin: Secondary | ICD-10-CM | POA: Diagnosis not present

## 2017-05-30 DIAGNOSIS — E119 Type 2 diabetes mellitus without complications: Secondary | ICD-10-CM | POA: Diagnosis not present

## 2017-05-30 DIAGNOSIS — Z79899 Other long term (current) drug therapy: Secondary | ICD-10-CM | POA: Diagnosis not present

## 2017-05-30 DIAGNOSIS — M79601 Pain in right arm: Secondary | ICD-10-CM | POA: Diagnosis not present

## 2017-05-30 MED ORDER — KETOROLAC TROMETHAMINE 60 MG/2ML IM SOLN
60.0000 mg | Freq: Once | INTRAMUSCULAR | Status: AC
  Administered 2017-05-30: 60 mg via INTRAMUSCULAR
  Filled 2017-05-30: qty 2

## 2017-05-30 NOTE — ED Notes (Signed)
Caregiver pick up pt.

## 2017-05-30 NOTE — ED Triage Notes (Signed)
EMS reports pt's fasting blood sugar this morning was 133.  Reports increased to 453 with ems after pt drank hot chocolate.  Pt c/o pain to r upper arm and r side.

## 2017-05-30 NOTE — Discharge Instructions (Signed)
Tylenol or ibuprofen for your pain.  Follow-up with your primary care doctor.

## 2017-05-30 NOTE — ED Triage Notes (Signed)
Per [facility], pt is a resident at [facility] and c/o r arm pain since last night.  Reports has been seen by his pcp for same and was told it could be gout.  Pt's blood sugar elevated at 453.

## 2017-05-30 NOTE — ED Notes (Signed)
[facility] called and notified that pt is up for discharge. They will be sending someone to pick to him up.
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
## 2017-06-17 DEATH — deceased

## 2017-10-06 IMAGING — DX DG CHEST 2V
2 series · 2 of 2 positions shown · non-contrast
Comparison: Chest radiograph performed 03/26/2013

CLINICAL DATA: Acute onset of central chest pain and shortness of
breath. Initial encounter.

EXAM:
CHEST  2 VIEW

[chest lat]
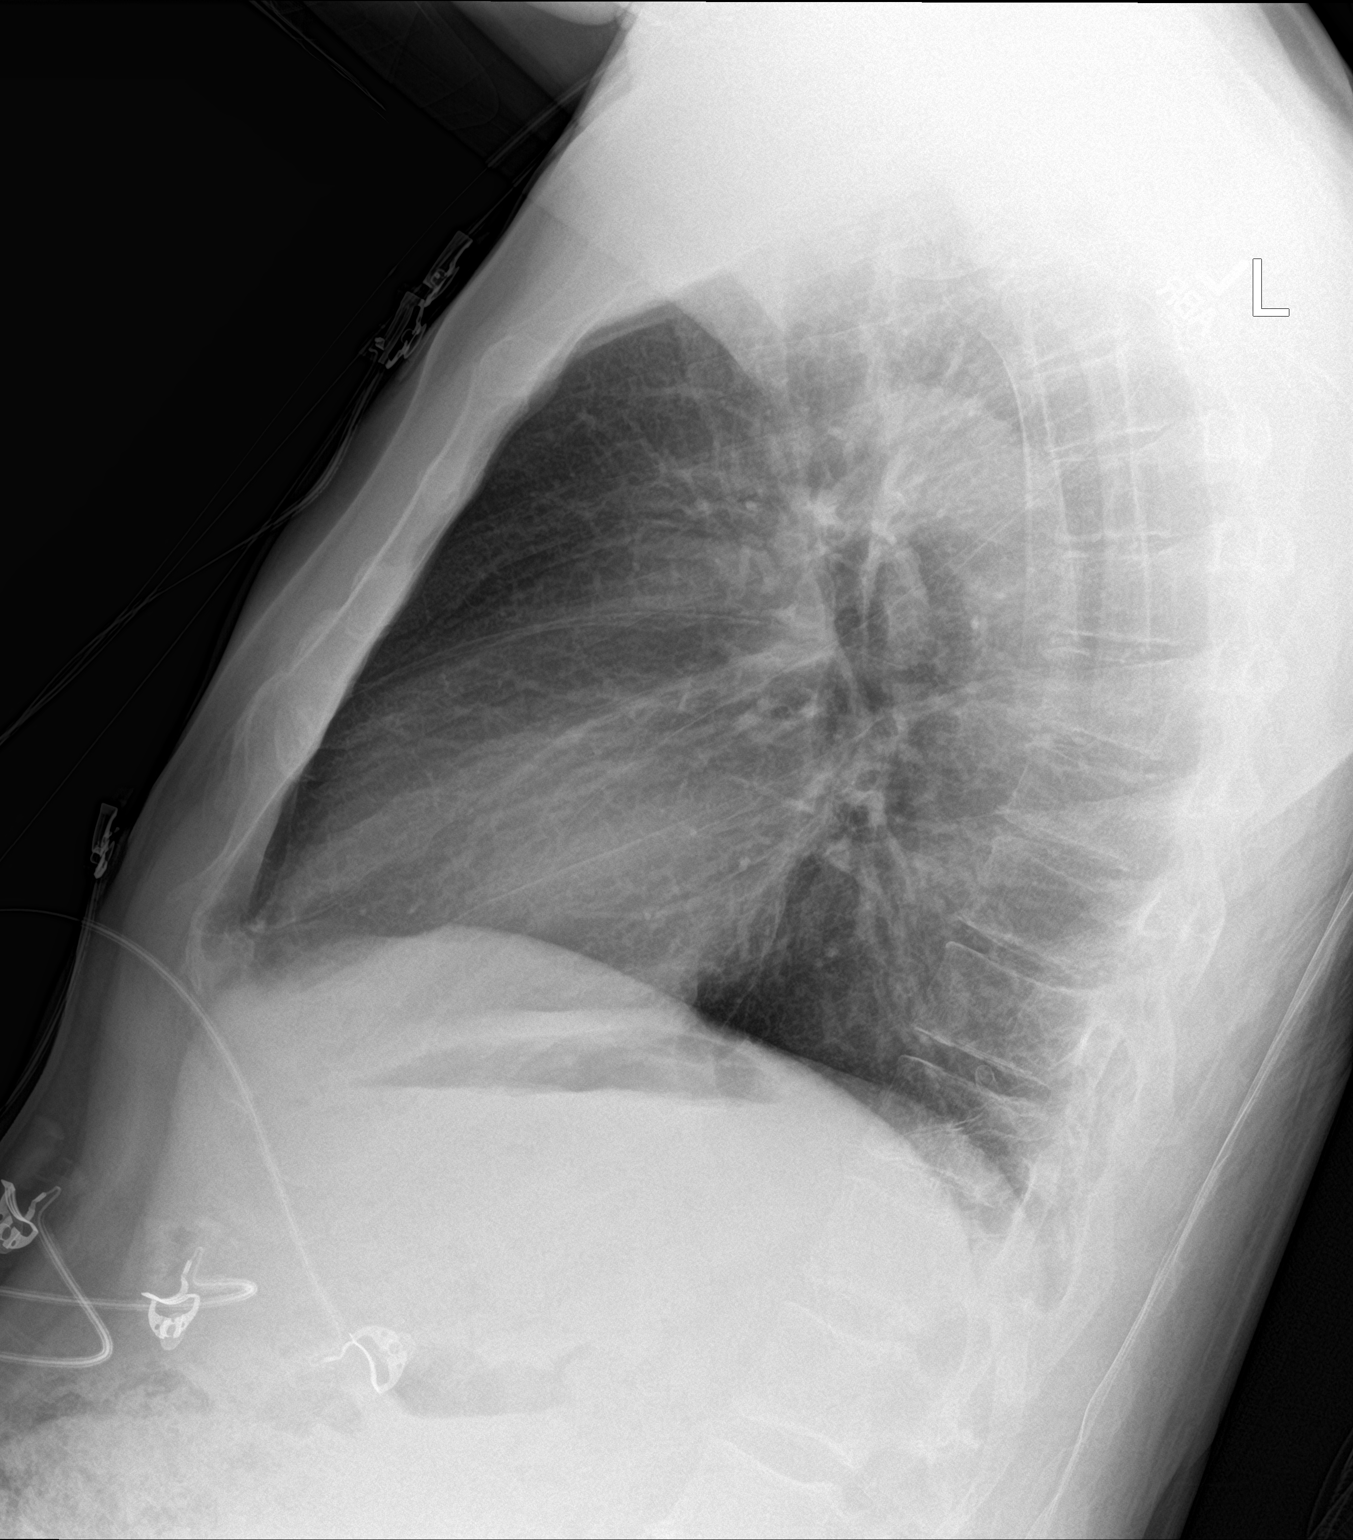

[chest ap]
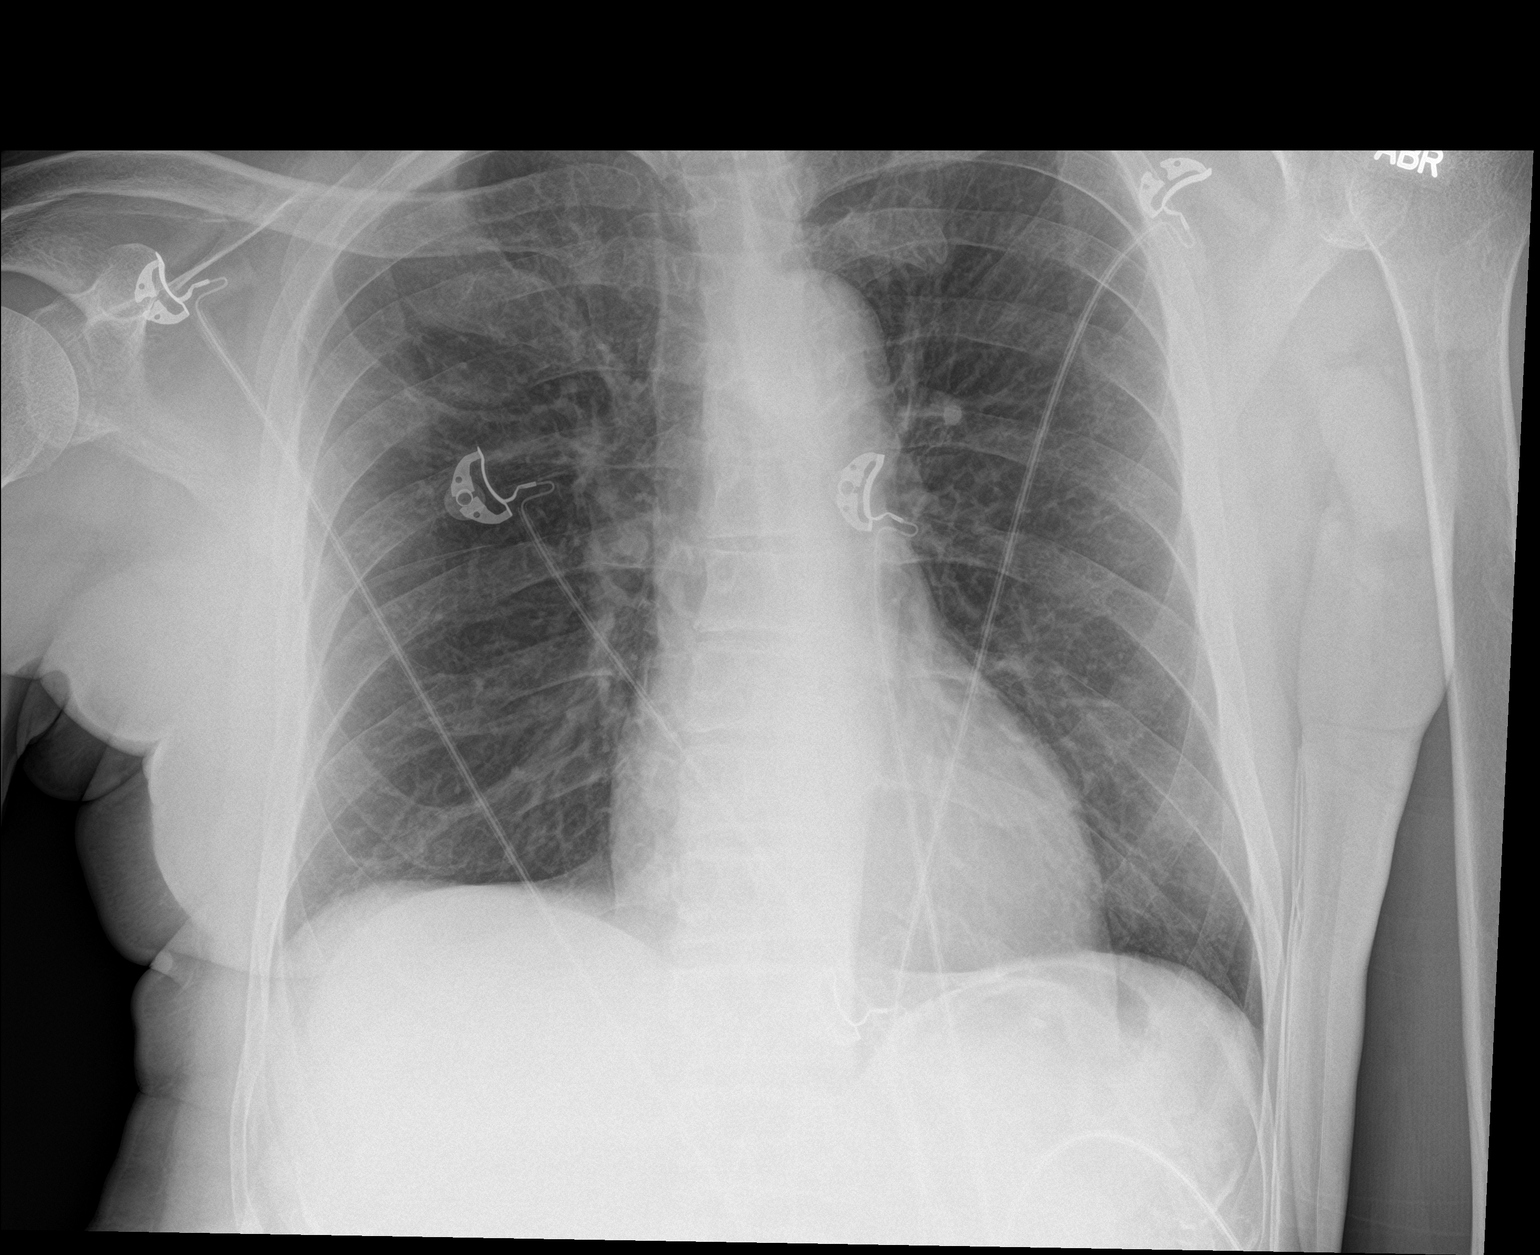

[2 of 2 positions shown; findings below may reference images not displayed]

FINDINGS: The lungs are well-aerated and clear. There is no evidence of focal
opacification, pleural effusion or pneumothorax.

The heart is normal in size; the mediastinal contour is within
normal limits. No acute osseous abnormalities are seen.
IMPRESSION: No acute cardiopulmonary process seen.

## 2021-02-13 ENCOUNTER — Encounter (INDEPENDENT_AMBULATORY_CARE_PROVIDER_SITE_OTHER): Payer: Self-pay | Admitting: *Deleted
